# Patient Record
Sex: Female | Born: 1975 | Race: White | Hispanic: No | Marital: Married | Smoking: Current every day smoker
Health system: Southern US, Community
[De-identification: ages and names within clinical notes are randomized; demographics above are authoritative.]

## PROBLEM LIST (undated history)

## (undated) DIAGNOSIS — M549 Dorsalgia, unspecified: Secondary | ICD-10-CM

## (undated) DIAGNOSIS — J45909 Unspecified asthma, uncomplicated: Secondary | ICD-10-CM

## (undated) DIAGNOSIS — E119 Type 2 diabetes mellitus without complications: Secondary | ICD-10-CM

## (undated) DIAGNOSIS — E7800 Pure hypercholesterolemia, unspecified: Secondary | ICD-10-CM

## (undated) DIAGNOSIS — N939 Abnormal uterine and vaginal bleeding, unspecified: Secondary | ICD-10-CM

## (undated) DIAGNOSIS — K589 Irritable bowel syndrome without diarrhea: Secondary | ICD-10-CM

## (undated) DIAGNOSIS — N83209 Unspecified ovarian cyst, unspecified side: Secondary | ICD-10-CM

## (undated) HISTORY — PX: CHOLECYSTECTOMY: SHX55

## (undated) HISTORY — PX: ENDOMETRIAL ABLATION: SHX621

## (undated) HISTORY — PX: WISDOM TOOTH EXTRACTION: SHX21

## (undated) HISTORY — PX: TUBAL LIGATION: SHX77

## (undated) HISTORY — PX: APPENDECTOMY: SHX54

---

## 1999-10-17 ENCOUNTER — Emergency Department (HOSPITAL_COMMUNITY): Admission: EM | Admit: 1999-10-17 | Discharge: 1999-10-17 | Payer: Self-pay | Admitting: Emergency Medicine

## 1999-10-17 ENCOUNTER — Encounter: Payer: Self-pay | Admitting: Emergency Medicine

## 1999-12-13 ENCOUNTER — Emergency Department (HOSPITAL_COMMUNITY): Admission: EM | Admit: 1999-12-13 | Discharge: 1999-12-13 | Payer: Self-pay

## 2000-02-24 ENCOUNTER — Other Ambulatory Visit: Admission: RE | Admit: 2000-02-24 | Discharge: 2000-02-24 | Payer: Self-pay | Admitting: Gynecology

## 2000-08-22 ENCOUNTER — Emergency Department (HOSPITAL_COMMUNITY): Admission: EM | Admit: 2000-08-22 | Discharge: 2000-08-22 | Payer: Self-pay | Admitting: Emergency Medicine

## 2000-08-26 ENCOUNTER — Inpatient Hospital Stay (HOSPITAL_COMMUNITY): Admission: AD | Admit: 2000-08-26 | Discharge: 2000-08-26 | Payer: Self-pay | Admitting: Obstetrics & Gynecology

## 2000-10-06 ENCOUNTER — Emergency Department (HOSPITAL_COMMUNITY): Admission: EM | Admit: 2000-10-06 | Discharge: 2000-10-06 | Payer: Self-pay | Admitting: Emergency Medicine

## 2000-11-03 ENCOUNTER — Emergency Department (HOSPITAL_COMMUNITY): Admission: EM | Admit: 2000-11-03 | Discharge: 2000-11-03 | Payer: Self-pay | Admitting: Emergency Medicine

## 2001-01-28 ENCOUNTER — Inpatient Hospital Stay (HOSPITAL_COMMUNITY): Admission: AD | Admit: 2001-01-28 | Discharge: 2001-01-28 | Payer: Self-pay | Admitting: Obstetrics

## 2001-07-04 ENCOUNTER — Emergency Department (HOSPITAL_COMMUNITY): Admission: EM | Admit: 2001-07-04 | Discharge: 2001-07-04 | Payer: Self-pay | Admitting: Emergency Medicine

## 2001-11-20 ENCOUNTER — Inpatient Hospital Stay (HOSPITAL_COMMUNITY): Admission: AD | Admit: 2001-11-20 | Discharge: 2001-11-20 | Payer: Self-pay | Admitting: Obstetrics

## 2001-11-27 ENCOUNTER — Inpatient Hospital Stay (HOSPITAL_COMMUNITY): Admission: AD | Admit: 2001-11-27 | Discharge: 2001-11-27 | Payer: Self-pay | Admitting: Obstetrics

## 2001-12-15 ENCOUNTER — Observation Stay (HOSPITAL_COMMUNITY): Admission: AD | Admit: 2001-12-15 | Discharge: 2001-12-16 | Payer: Self-pay | Admitting: Obstetrics

## 2001-12-16 ENCOUNTER — Encounter: Payer: Self-pay | Admitting: Obstetrics

## 2002-01-10 ENCOUNTER — Inpatient Hospital Stay (HOSPITAL_COMMUNITY): Admission: AD | Admit: 2002-01-10 | Discharge: 2002-01-10 | Payer: Self-pay | Admitting: Obstetrics

## 2002-01-16 ENCOUNTER — Inpatient Hospital Stay (HOSPITAL_COMMUNITY): Admission: AD | Admit: 2002-01-16 | Discharge: 2002-01-16 | Payer: Self-pay | Admitting: Obstetrics

## 2002-01-27 ENCOUNTER — Inpatient Hospital Stay (HOSPITAL_COMMUNITY): Admission: AD | Admit: 2002-01-27 | Discharge: 2002-01-27 | Payer: Self-pay | Admitting: Obstetrics

## 2002-01-28 ENCOUNTER — Inpatient Hospital Stay (HOSPITAL_COMMUNITY): Admission: AD | Admit: 2002-01-28 | Discharge: 2002-01-30 | Payer: Self-pay | Admitting: Obstetrics

## 2002-08-11 ENCOUNTER — Encounter: Payer: Self-pay | Admitting: Family Medicine

## 2002-08-11 ENCOUNTER — Encounter: Admission: RE | Admit: 2002-08-11 | Discharge: 2002-08-11 | Payer: Self-pay | Admitting: Family Medicine

## 2003-03-09 ENCOUNTER — Emergency Department (HOSPITAL_COMMUNITY): Admission: EM | Admit: 2003-03-09 | Discharge: 2003-03-09 | Payer: Self-pay | Admitting: Emergency Medicine

## 2003-03-11 ENCOUNTER — Emergency Department (HOSPITAL_COMMUNITY): Admission: EM | Admit: 2003-03-11 | Discharge: 2003-03-11 | Payer: Self-pay | Admitting: Emergency Medicine

## 2003-05-06 ENCOUNTER — Emergency Department (HOSPITAL_COMMUNITY): Admission: EM | Admit: 2003-05-06 | Discharge: 2003-05-06 | Payer: Self-pay | Admitting: Emergency Medicine

## 2003-08-26 ENCOUNTER — Inpatient Hospital Stay (HOSPITAL_COMMUNITY): Admission: AD | Admit: 2003-08-26 | Discharge: 2003-08-26 | Payer: Self-pay | Admitting: Obstetrics

## 2003-09-16 ENCOUNTER — Ambulatory Visit (HOSPITAL_COMMUNITY): Admission: RE | Admit: 2003-09-16 | Discharge: 2003-09-16 | Payer: Self-pay | Admitting: Obstetrics

## 2003-09-22 ENCOUNTER — Inpatient Hospital Stay (HOSPITAL_COMMUNITY): Admission: AD | Admit: 2003-09-22 | Discharge: 2003-09-22 | Payer: Self-pay | Admitting: Obstetrics

## 2003-09-30 ENCOUNTER — Ambulatory Visit (HOSPITAL_COMMUNITY): Admission: RE | Admit: 2003-09-30 | Discharge: 2003-09-30 | Payer: Self-pay | Admitting: Obstetrics

## 2003-10-07 ENCOUNTER — Ambulatory Visit (HOSPITAL_COMMUNITY): Admission: RE | Admit: 2003-10-07 | Discharge: 2003-10-07 | Payer: Self-pay | Admitting: Obstetrics

## 2003-10-14 ENCOUNTER — Ambulatory Visit (HOSPITAL_COMMUNITY): Admission: RE | Admit: 2003-10-14 | Discharge: 2003-10-14 | Payer: Self-pay | Admitting: Obstetrics

## 2003-10-21 ENCOUNTER — Encounter: Payer: Self-pay | Admitting: Obstetrics

## 2003-10-21 ENCOUNTER — Ambulatory Visit (HOSPITAL_COMMUNITY): Admission: RE | Admit: 2003-10-21 | Discharge: 2003-10-21 | Payer: Self-pay | Admitting: Obstetrics

## 2003-10-28 ENCOUNTER — Ambulatory Visit (HOSPITAL_COMMUNITY): Admission: RE | Admit: 2003-10-28 | Discharge: 2003-10-28 | Payer: Self-pay | Admitting: Obstetrics

## 2003-11-02 ENCOUNTER — Inpatient Hospital Stay (HOSPITAL_COMMUNITY): Admission: AD | Admit: 2003-11-02 | Discharge: 2003-11-03 | Payer: Self-pay | Admitting: Obstetrics

## 2003-11-04 ENCOUNTER — Observation Stay (HOSPITAL_COMMUNITY): Admission: AD | Admit: 2003-11-04 | Discharge: 2003-11-05 | Payer: Self-pay | Admitting: Obstetrics

## 2003-11-04 ENCOUNTER — Ambulatory Visit (HOSPITAL_COMMUNITY): Admission: RE | Admit: 2003-11-04 | Discharge: 2003-11-04 | Payer: Self-pay | Admitting: Obstetrics

## 2003-11-11 ENCOUNTER — Ambulatory Visit (HOSPITAL_COMMUNITY): Admission: RE | Admit: 2003-11-11 | Discharge: 2003-11-11 | Payer: Self-pay | Admitting: Obstetrics

## 2003-11-17 ENCOUNTER — Inpatient Hospital Stay (HOSPITAL_COMMUNITY): Admission: AD | Admit: 2003-11-17 | Discharge: 2003-11-17 | Payer: Self-pay | Admitting: Obstetrics

## 2003-11-18 ENCOUNTER — Ambulatory Visit (HOSPITAL_COMMUNITY): Admission: RE | Admit: 2003-11-18 | Discharge: 2003-11-18 | Payer: Self-pay | Admitting: Obstetrics

## 2003-11-19 ENCOUNTER — Inpatient Hospital Stay (HOSPITAL_COMMUNITY): Admission: AD | Admit: 2003-11-19 | Discharge: 2003-11-19 | Payer: Self-pay | Admitting: Obstetrics

## 2003-11-24 ENCOUNTER — Observation Stay (HOSPITAL_COMMUNITY): Admission: AD | Admit: 2003-11-24 | Discharge: 2003-11-25 | Payer: Self-pay | Admitting: Obstetrics

## 2003-11-25 ENCOUNTER — Encounter: Payer: Self-pay | Admitting: Obstetrics

## 2003-11-29 ENCOUNTER — Inpatient Hospital Stay (HOSPITAL_COMMUNITY): Admission: AD | Admit: 2003-11-29 | Discharge: 2003-11-30 | Payer: Self-pay | Admitting: Obstetrics

## 2003-12-02 ENCOUNTER — Ambulatory Visit (HOSPITAL_COMMUNITY): Admission: RE | Admit: 2003-12-02 | Discharge: 2003-12-02 | Payer: Self-pay | Admitting: Obstetrics

## 2003-12-05 ENCOUNTER — Inpatient Hospital Stay (HOSPITAL_COMMUNITY): Admission: AD | Admit: 2003-12-05 | Discharge: 2003-12-06 | Payer: Self-pay | Admitting: Obstetrics

## 2003-12-07 ENCOUNTER — Inpatient Hospital Stay (HOSPITAL_COMMUNITY): Admission: AD | Admit: 2003-12-07 | Discharge: 2003-12-10 | Payer: Self-pay | Admitting: Obstetrics

## 2004-01-26 ENCOUNTER — Ambulatory Visit (HOSPITAL_COMMUNITY): Admission: RE | Admit: 2004-01-26 | Discharge: 2004-01-26 | Payer: Self-pay | Admitting: Obstetrics

## 2004-10-04 ENCOUNTER — Ambulatory Visit (HOSPITAL_COMMUNITY): Admission: RE | Admit: 2004-10-04 | Discharge: 2004-10-04 | Payer: Self-pay | Admitting: Obstetrics

## 2004-10-04 ENCOUNTER — Encounter (INDEPENDENT_AMBULATORY_CARE_PROVIDER_SITE_OTHER): Payer: Self-pay | Admitting: *Deleted

## 2005-09-29 ENCOUNTER — Emergency Department: Payer: Self-pay | Admitting: Emergency Medicine

## 2006-01-30 ENCOUNTER — Emergency Department (HOSPITAL_COMMUNITY): Admission: EM | Admit: 2006-01-30 | Discharge: 2006-01-30 | Payer: Self-pay | Admitting: Emergency Medicine

## 2006-03-06 ENCOUNTER — Emergency Department (HOSPITAL_COMMUNITY): Admission: EM | Admit: 2006-03-06 | Discharge: 2006-03-06 | Payer: Self-pay | Admitting: Family Medicine

## 2006-07-03 ENCOUNTER — Emergency Department (HOSPITAL_COMMUNITY): Admission: EM | Admit: 2006-07-03 | Discharge: 2006-07-03 | Payer: Self-pay | Admitting: Emergency Medicine

## 2006-09-03 ENCOUNTER — Emergency Department (HOSPITAL_COMMUNITY): Admission: EM | Admit: 2006-09-03 | Discharge: 2006-09-03 | Payer: Self-pay | Admitting: Emergency Medicine

## 2007-02-01 ENCOUNTER — Encounter: Admission: RE | Admit: 2007-02-01 | Discharge: 2007-02-01 | Payer: Self-pay | Admitting: Family Medicine

## 2007-03-27 ENCOUNTER — Encounter: Admission: RE | Admit: 2007-03-27 | Discharge: 2007-03-27 | Payer: Self-pay | Admitting: Internal Medicine

## 2007-04-12 ENCOUNTER — Ambulatory Visit (HOSPITAL_COMMUNITY): Admission: RE | Admit: 2007-04-12 | Discharge: 2007-04-12 | Payer: Self-pay | Admitting: Obstetrics

## 2007-12-08 ENCOUNTER — Emergency Department: Payer: Self-pay | Admitting: Unknown Physician Specialty

## 2008-01-06 ENCOUNTER — Emergency Department: Payer: Self-pay | Admitting: Emergency Medicine

## 2008-02-04 ENCOUNTER — Emergency Department (HOSPITAL_COMMUNITY): Admission: EM | Admit: 2008-02-04 | Discharge: 2008-02-04 | Payer: Self-pay | Admitting: Family Medicine

## 2008-02-04 ENCOUNTER — Emergency Department: Payer: Self-pay | Admitting: Emergency Medicine

## 2008-05-19 ENCOUNTER — Emergency Department: Payer: Self-pay | Admitting: Emergency Medicine

## 2008-06-02 ENCOUNTER — Emergency Department: Payer: Self-pay | Admitting: Emergency Medicine

## 2008-10-29 ENCOUNTER — Inpatient Hospital Stay: Payer: Self-pay | Admitting: Vascular Surgery

## 2009-10-20 ENCOUNTER — Ambulatory Visit: Payer: Self-pay | Admitting: Internal Medicine

## 2010-05-09 IMAGING — CR DG LUMBAR SPINE 2-3V
1 series · 4 of 4 positions shown · non-contrast
Comparison: none

REASON FOR EXAM: pain
COMMENTS:

[Series 1: view not recorded · 0.17mm/px · 4 of 4 slices shown]
[im 1/4]
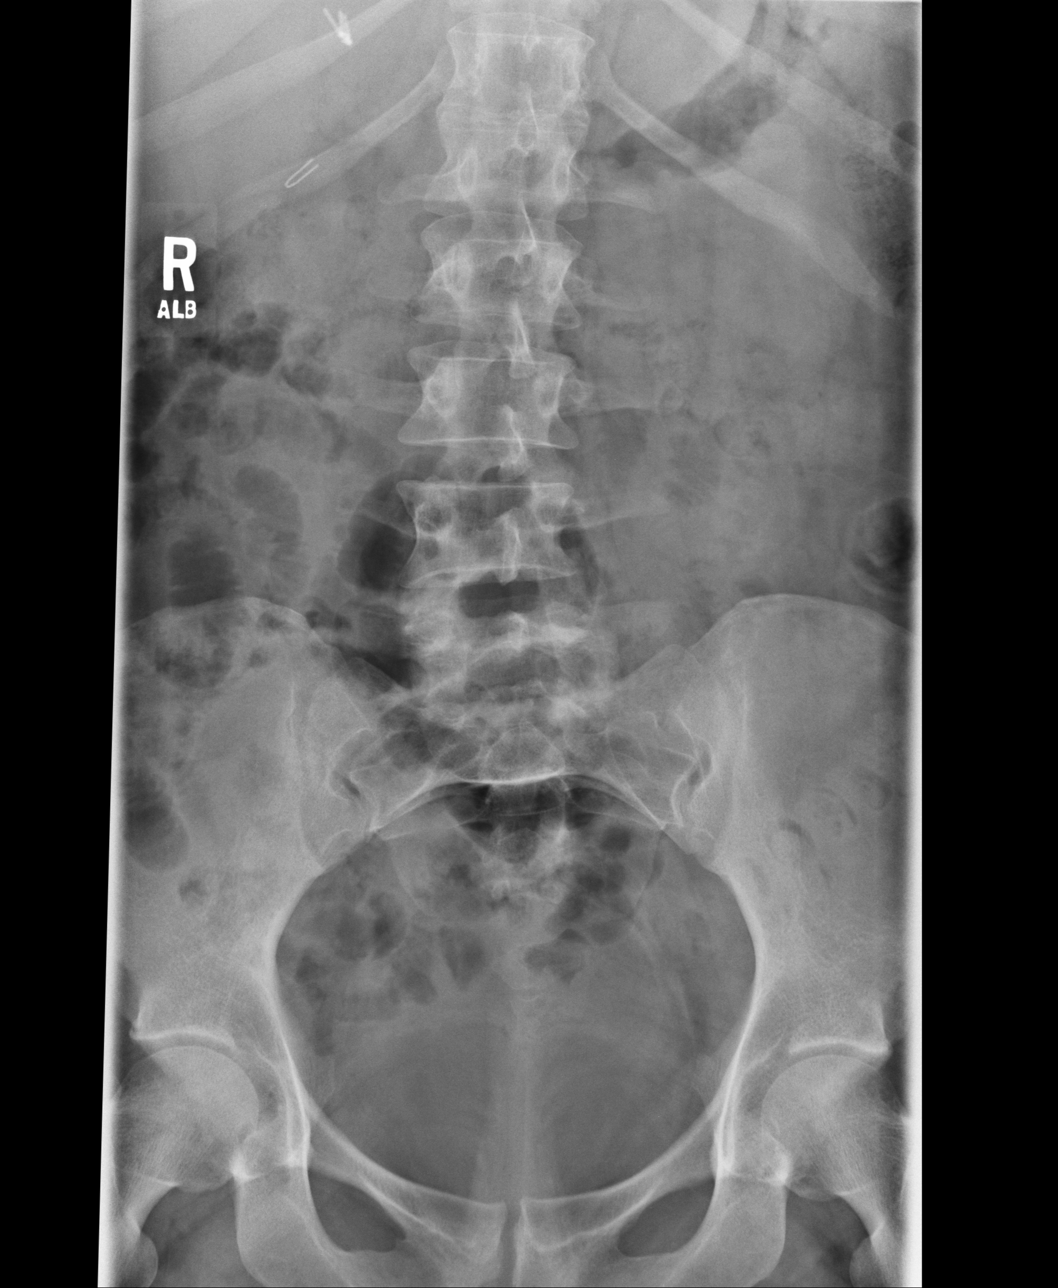
[im 2/4]
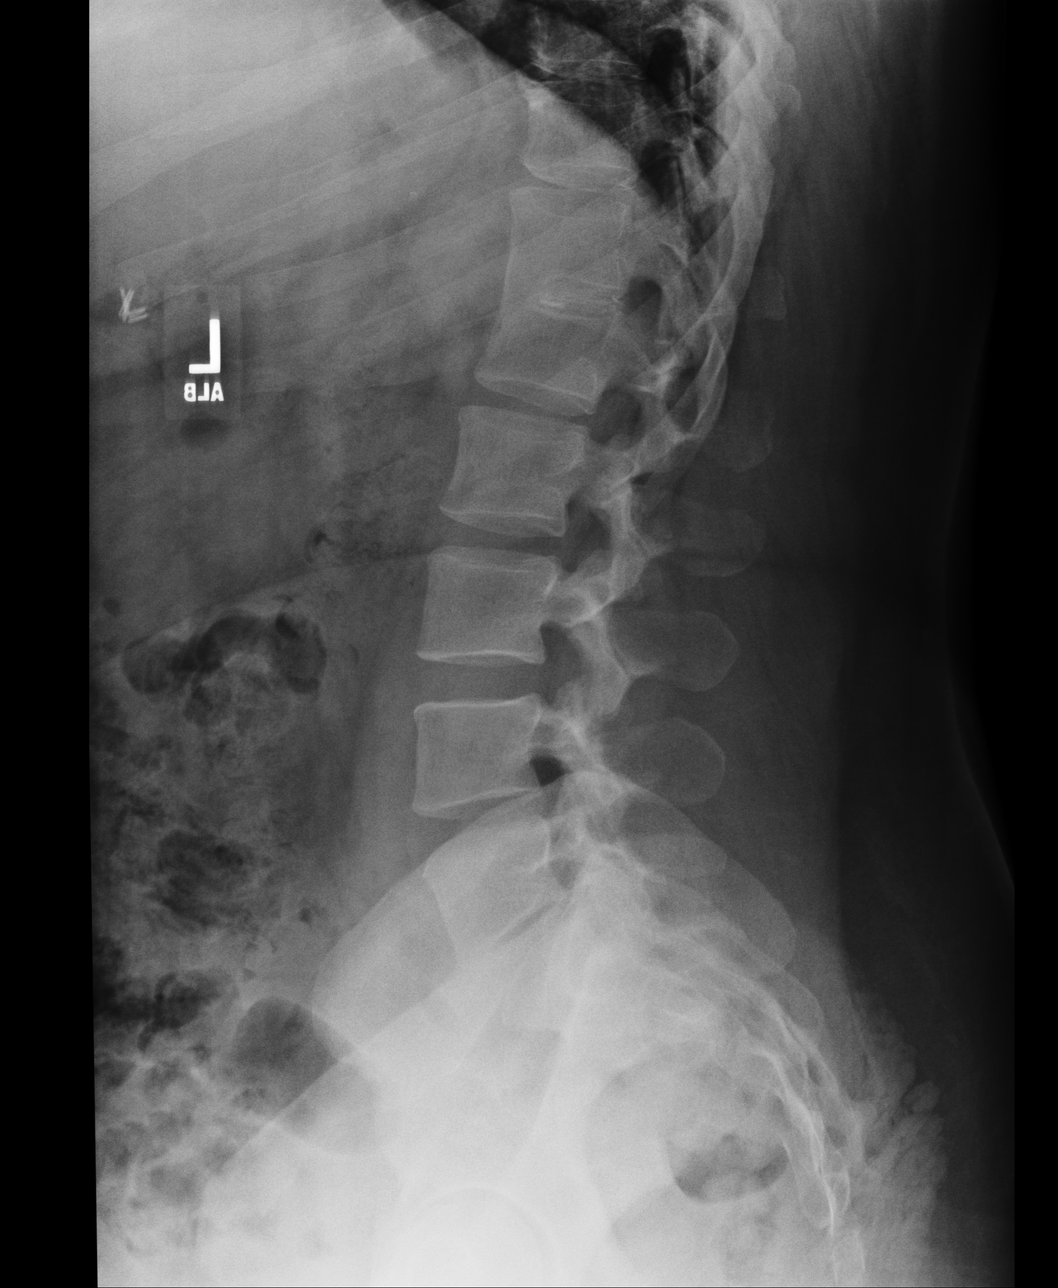
[im 3/4]
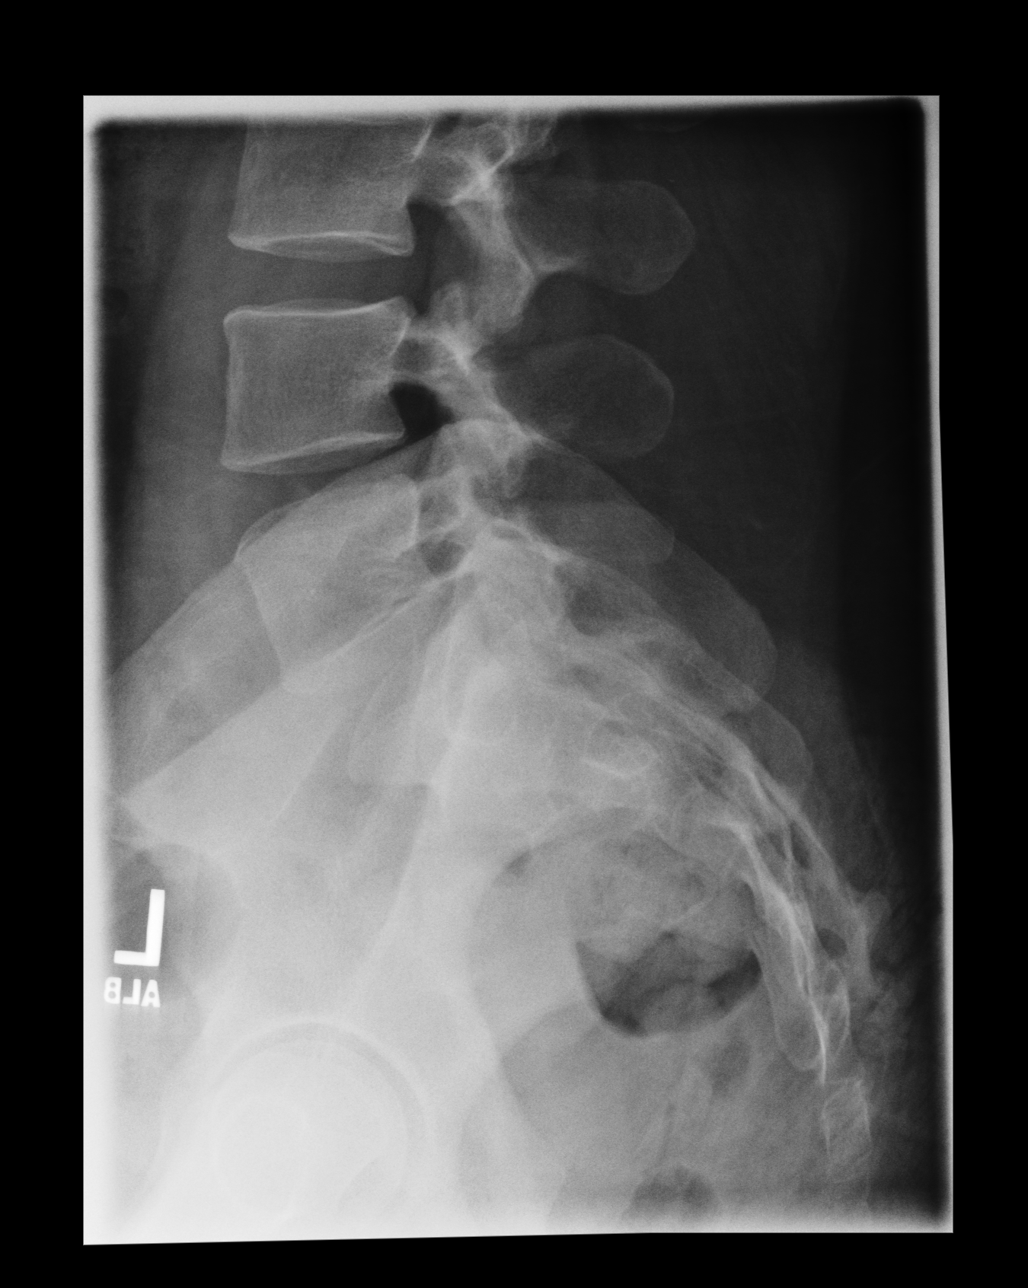
[im 4/4]
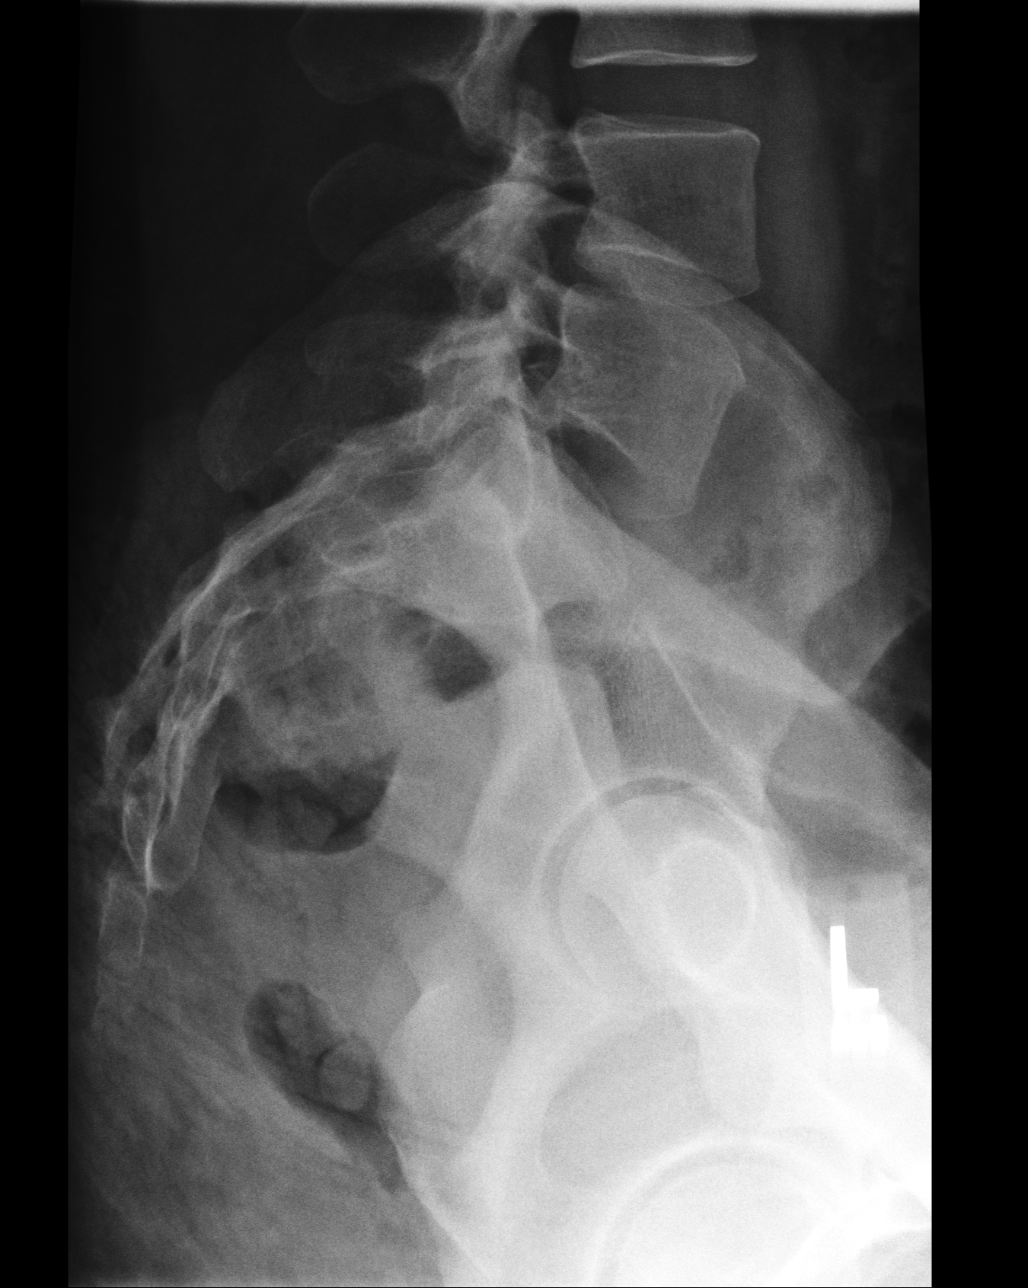

[4 of 4 positions shown; findings below may reference images not displayed]

PROCEDURE:     DXR - DXR LUMBAR SPINE AP AND LATERAL  - January 06, 2008  [DATE]

RESULT:     The vertebral body heights are well maintained. No fracture is
seen. The vertebral body alignment is normal. There is narrowing of the
L4-L5 intervertebral disc space. This could either be developmental or
secondary to early manifestation of disc disease. There is no associated
sclerosis of the adjacent vertebral plates and no spur formation is seen at
this level. There is anterior fusion of T12 and L1 consistent with a
developmental anomaly. The pedicles are bilaterally intact.
IMPRESSION: 1. No fracture is seen.
2. There is slight narrowing of the L4-L5 intervertebral disc space. For a
patient of this age the finding is likely developmental. If, however, there
is symptomatology referable to this level, then further evaluation by MR
would be recommended.
3. There is noted partial congenital fusion of the T12 and L1 vertebral
bodies.

## 2010-06-05 ENCOUNTER — Encounter: Payer: Self-pay | Admitting: Obstetrics

## 2010-06-06 ENCOUNTER — Encounter: Payer: Self-pay | Admitting: Family Medicine

## 2010-09-30 NOTE — Op Note (Signed)
NAME:  Karen Solis, Karen Solis                       ACCOUNT NO.:  [id number]   MEDICAL RECORD NO.:  [id number]                   PATIENT TYPE:  AMB   LOCATION:  SDC                                  FACILITY:  [facility]   PHYSICIAN:  Kathreen Cosier, M.D.           DATE OF BIRTH:  Dec 29, 1975   DATE OF PROCEDURE:  01/26/2004  DATE OF DISCHARGE:                                 OPERATIVE REPORT   PREOPERATIVE DIAGNOSIS:  Multiparity.   PROCEDURE:  Open laparoscopic tubal sterilization.   ANESTHESIA:  General anesthesia.   PROCEDURE IN DETAIL:  The patient in the lithotomy position.  Abdomen,  peritoneum, and vagina prepped and draped.  Bladder emptied with straight  catheter.  A transverse incision made through the old subumbilical incision,  carried down to the fascia.  The fascia grasped with two Kochers and the  fascia opened with the Mayo scissors.  The sleeve of the trocar inserted  intraperitoneally.  3 L of carbon dioxide infused intraperitoneally.  The  uterus was visualized and the scope inserted through the sleeve of the  trocar and the uterus, tubes, and ovaries were normal.  Cautery probe  inserted through the sleeve of the scope.  Right tube grasped 1 inch from  the cornua and cauterized.  The tube cauterized in a total of four places  moving lateral from the first site of cautery.  The procedure was done in a  similar fashion on the other side.  Probes were removed.  CO2 allowed to  escape from the peritoneal cavity.  The fascia closed with one stitch of 0  Dexon and the skin closed with subcuticular stitch of 3-0 Monocryl.                                               Kathreen Cosier, M.D.    BAM/MEDQ  D:  01/26/2004  T:  01/26/2004  Job:  [id number]

## 2010-09-30 NOTE — Op Note (Signed)
Karen Solis, Karen Solis             ACCOUNT NO.:  [id number]   MEDICAL RECORD NO.:  [id number]          PATIENT TYPE:  AMB   LOCATION:  SDC                           FACILITY:  [facility]   PHYSICIAN:  Kathreen Cosier, M.D.DATE OF BIRTH:  05/06/1976   DATE OF PROCEDURE:  10/04/2004  DATE OF DISCHARGE:                                 OPERATIVE REPORT   PREOPERATIVE DIAGNOSIS:  Dysfunctional uterine bleeding.   Under general anesthesia, with the patient in the lithotomy position,  perineum and vagina were prepped and draped.  Bladder emptied with a  straight catheter.  Bimanual exam revealed a normal-size uterus.  Negative  adnexa.  Speculum placed in the vagina.  Anterior lip of the cervix grasped  with a tenaculum.  Endocervix curetted.  Small amount of tissue obtained.  Endometrial cavity sounded to 9 cm.  Then, using Hagar dilators, the cervix  was measured at 5 cm, making a cavity length of 4 cm.  The cervix was  dilated with #25 Shawnie Pons and a 5 mm hysteroscope inserted.  The cavity was  normal.  Sharp curettage performed, with a moderate amount of tissue  obtained.  Then, a NovaSure device was inserted in the cavity and integrity  test passed.  Ablation was performed for 1 minute 45 seconds at 66 watts,  and then the hysteroscopy was repeated, and there was total ablation of the  cavity.  Fluid deficit was 20 cc.  The patient tolerated the procedure well  and was taken to the recovery room in good condition.      BAM/MEDQ  D:  10/04/2004  T:  10/04/2004  Job:  [id number]

## 2010-11-22 ENCOUNTER — Emergency Department: Payer: Self-pay | Admitting: Emergency Medicine

## 2010-12-13 ENCOUNTER — Ambulatory Visit: Payer: Self-pay | Admitting: Cardiology

## 2011-01-05 ENCOUNTER — Emergency Department: Payer: Self-pay | Admitting: Emergency Medicine

## 2011-02-13 LAB — POCT RAPID STREP A: Streptococcus, Group A Screen (Direct): NEGATIVE

## 2011-04-03 ENCOUNTER — Emergency Department: Payer: Self-pay | Admitting: Emergency Medicine

## 2011-04-25 ENCOUNTER — Emergency Department: Payer: Self-pay | Admitting: Emergency Medicine

## 2012-10-09 ENCOUNTER — Emergency Department (HOSPITAL_COMMUNITY): Payer: BC Managed Care – PPO

## 2012-10-09 ENCOUNTER — Encounter (HOSPITAL_COMMUNITY): Payer: Self-pay | Admitting: *Deleted

## 2012-10-09 ENCOUNTER — Emergency Department (HOSPITAL_COMMUNITY)
Admission: EM | Admit: 2012-10-09 | Discharge: 2012-10-09 | Disposition: A | Discharge status: Home / Self Care | Payer: BC Managed Care – PPO | Attending: Emergency Medicine | Admitting: Emergency Medicine

## 2012-10-09 DIAGNOSIS — M549 Dorsalgia, unspecified: Secondary | ICD-10-CM | POA: Insufficient documentation

## 2012-10-09 DIAGNOSIS — F17200 Nicotine dependence, unspecified, uncomplicated: Secondary | ICD-10-CM | POA: Insufficient documentation

## 2012-10-09 DIAGNOSIS — Y939 Activity, unspecified: Secondary | ICD-10-CM | POA: Insufficient documentation

## 2012-10-09 DIAGNOSIS — E7800 Pure hypercholesterolemia, unspecified: Secondary | ICD-10-CM | POA: Insufficient documentation

## 2012-10-09 DIAGNOSIS — S93609A Unspecified sprain of unspecified foot, initial encounter: Secondary | ICD-10-CM | POA: Insufficient documentation

## 2012-10-09 DIAGNOSIS — M25562 Pain in left knee: Secondary | ICD-10-CM

## 2012-10-09 DIAGNOSIS — Y929 Unspecified place or not applicable: Secondary | ICD-10-CM | POA: Insufficient documentation

## 2012-10-09 DIAGNOSIS — S8000XA Contusion of unspecified knee, initial encounter: Secondary | ICD-10-CM | POA: Insufficient documentation

## 2012-10-09 DIAGNOSIS — W010XXA Fall on same level from slipping, tripping and stumbling without subsequent striking against object, initial encounter: Secondary | ICD-10-CM | POA: Insufficient documentation

## 2012-10-09 DIAGNOSIS — G8929 Other chronic pain: Secondary | ICD-10-CM | POA: Insufficient documentation

## 2012-10-09 DIAGNOSIS — Z87828 Personal history of other (healed) physical injury and trauma: Secondary | ICD-10-CM | POA: Insufficient documentation

## 2012-10-09 DIAGNOSIS — Z79899 Other long term (current) drug therapy: Secondary | ICD-10-CM | POA: Insufficient documentation

## 2012-10-09 DIAGNOSIS — T148XXA Other injury of unspecified body region, initial encounter: Secondary | ICD-10-CM

## 2012-10-09 DIAGNOSIS — S93601A Unspecified sprain of right foot, initial encounter: Secondary | ICD-10-CM

## 2012-10-09 HISTORY — DX: Pure hypercholesterolemia, unspecified: E78.00

## 2012-10-09 HISTORY — DX: Dorsalgia, unspecified: M54.9

## 2012-10-09 NOTE — ED Provider Notes (Signed)
History     CSN: [id number]  Arrival date & time 10/09/12  1016   First MD Initiated Contact with Patient 10/09/12 1035      Chief Complaint  Patient presents with  . Foot Pain  . Knee Pain    (Consider location/radiation/quality/duration/timing/severity/associated sxs/prior treatment) HPI Pt is a 37yo obese female c/o left knee pain and right foot pain after slipping and falling this morning.  Pt has hx of chronic back pain and is in pain management.  Pt states she slipped on slick spot on floor at her house.  Landed on left knee and right foot bent back behind her. Pain in left knee is aching, 9/10, constant, does not radiate.  Pain in right foot on dorsal aspect is achy and sharp, 9/10, constant and does not radiate.  Pain is worse with weight bearing. Reports hitting her head but no LOC.  Pt states she did not hit her head very hard.   Past Medical History  Diagnosis Date  . High cholesterol   . Back pain     2012 back injury, in pain management    Past Surgical History  Procedure Laterality Date  . Wisdom tooth extraction      1997  . Cholecystectomy      2000  . Tubal ligation      2005  . Endometrial ablation      2005  . Appendectomy      2010    History reviewed. No pertinent family history.  History  Substance Use Topics  . Smoking status: Current Every Day Smoker -- 0.50 packs/day for 15 years    Types: Cigarettes  . Smokeless tobacco: Not on file  . Alcohol Use: No    OB History   Grav Para Term Preterm Abortions TAB SAB Ect Mult Living                  Review of Systems  Musculoskeletal: Positive for myalgias, joint swelling and arthralgias.  Skin: Negative.   Neurological: Negative for dizziness, syncope and headaches.    Allergies  Review of patient's allergies indicates no known allergies.  Home Medications   Current Outpatient Rx  Name  Route  Sig  Dispense  Refill  . ALPRAZolam (XANAX) 0.5 MG tablet   Oral   Take 0.5 mg by mouth  at bedtime as needed for sleep.         Marland Kitchen escitalopram (LEXAPRO) 10 MG tablet   Oral   Take 10 mg by mouth daily.         Marland Kitchen oxyCODONE-acetaminophen (PERCOCET) 5-325 MG per tablet   Oral   Take 1 tablet by mouth every 8 (eight) hours as needed for pain.         . simvastatin (ZOCOR) 20 MG tablet   Oral   Take 20 mg by mouth every evening.           BP 145/83  Pulse 69  Temp(Src) 97.9 F (36.6 C) (Oral)  Resp 18  SpO2 99%  LMP 10/12/2003  Physical Exam  Nursing note and vitals reviewed. Constitutional: She appears well-developed and well-nourished. No distress.  Morbidly obese female sitting in wheelchair with right foot propped up  HENT:  Head: Normocephalic and atraumatic.  Eyes: Conjunctivae are normal. No scleral icterus.  Neck: Normal range of motion.  Cardiovascular: Normal rate, regular rhythm and normal heart sounds.   Pulmonary/Chest: Effort normal and breath sounds normal. No respiratory distress. She has no wheezes.  Musculoskeletal: Normal range of motion. She exhibits edema ( dorsum of right foot) and tenderness ( dorsom of right foot, medial aspect of left knee).  Left knee pain. Right foot pain, CMS in tact.  Neurological: She is alert.  Skin: Skin is warm and dry. No rash noted. She is not diaphoretic. No erythema. No pallor.  Ecchymosis medial aspect left knee  Psychiatric: She has a normal mood and affect. Her behavior is normal.    ED Course  Procedures (including critical care time)  Labs Reviewed - No data to display Dg Knee Complete 4 Views Left  10/09/2012   *RADIOLOGY REPORT*  Clinical Data: Knee pain following injury  LEFT KNEE - COMPLETE 4+ VIEW  Comparison: None.  Findings: No acute fracture or dislocation is noted.  No soft tissue abnormality is seen.  IMPRESSION: No acute abnormality noted.   Original Report Authenticated By: Alcide Clever, M.D.   Dg Knee Complete 4 Views Right  10/09/2012   *RADIOLOGY REPORT*  Clinical Data: Knee  pain following injury  RIGHT KNEE - COMPLETE 4+ VIEW  Comparison: None.  Findings: No acute fracture or dislocation is noted.  No soft tissue abnormality is seen.  IMPRESSION: No acute abnormality noted.   Original Report Authenticated By: Alcide Clever, M.D.   Dg Foot Complete Right  10/09/2012   *RADIOLOGY REPORT*  Clinical Data: Foot pain  RIGHT FOOT COMPLETE - 3+ VIEW  Comparison: None.  Findings: Normal alignment without fracture.  Preserved joint spaces.  No soft tissue abnormality or radiopaque foreign body.  IMPRESSION: No acute osseous finding   Original Report Authenticated By: Judie Petit. Shick, M.D.     1. Contusion   2. Right foot sprain, initial encounter   3. Left knee pain       MDM  Pt with hx of chronic back pain, is in pain management, c/o left knee pain and right foot pain.  Pt is morbidly obese. Pt was in wheelchair in exam room.  Pain with weight bearing and ambulation. Contusion on medial aspect of left knee and TTP.  FROM.  Right foot: mild edema to dorsal aspect.  No ecchymosis or wound.  TTP. CMS in tact.   Palin film left knee: no acute abnormality, no fx or dislocation.  Plain film right foot: no acute osseous finding. No fx.  Rx: wheelchair. Post-op shoe Pt in pain management, does not need pain meds. May take OTC tylenol and ibuprofen for swelling and use ice.  Vitals: unremarkable. Discharged in stable condition.    Discussed pt with attending during ED encounter.   Junius Finner, PA-C 10/09/12 1539

## 2012-10-09 NOTE — ED Notes (Signed)
Pt reports she slipped and fell this morning. Landed on left knee, pain to right foot, pain to both knees. Left knee swollen. Right foot swollen with decreased sensation. Pain 9/10. Reports she hit her head but denies LOC.

## 2012-10-09 NOTE — ED Provider Notes (Signed)
Medical screening examination/treatment/procedure(s) were performed by non-physician practitioner and as supervising physician I was immediately available for consultation/collaboration.    Dasie Chancellor R Iran Rowe, MD 10/09/12 1612 

## 2012-10-19 ENCOUNTER — Encounter (HOSPITAL_COMMUNITY): Payer: Self-pay | Admitting: *Deleted

## 2012-10-19 ENCOUNTER — Emergency Department (HOSPITAL_COMMUNITY)
Admission: EM | Admit: 2012-10-19 | Discharge: 2012-10-19 | Disposition: A | Discharge status: Home / Self Care | Payer: BC Managed Care – PPO | Attending: Emergency Medicine | Admitting: Emergency Medicine

## 2012-10-19 DIAGNOSIS — Z79899 Other long term (current) drug therapy: Secondary | ICD-10-CM | POA: Insufficient documentation

## 2012-10-19 DIAGNOSIS — F17200 Nicotine dependence, unspecified, uncomplicated: Secondary | ICD-10-CM | POA: Insufficient documentation

## 2012-10-19 DIAGNOSIS — Z4789 Encounter for other orthopedic aftercare: Secondary | ICD-10-CM | POA: Insufficient documentation

## 2012-10-19 DIAGNOSIS — Z8739 Personal history of other diseases of the musculoskeletal system and connective tissue: Secondary | ICD-10-CM | POA: Insufficient documentation

## 2012-10-19 DIAGNOSIS — E7800 Pure hypercholesterolemia, unspecified: Secondary | ICD-10-CM | POA: Insufficient documentation

## 2012-10-19 DIAGNOSIS — Z4689 Encounter for fitting and adjustment of other specified devices: Secondary | ICD-10-CM | POA: Insufficient documentation

## 2012-10-19 NOTE — Consult Note (Signed)
Reason for Consult:right foot fracture, cast problem Referring Physician: EDP  Karen Solis is an 37 y.o. female.  HPI: Patient is 10 days out from a fall with a nondisplaced right foot fracture. Patient of Dr Toni Arthurs, GOC. Patient complained of increasing right foot and ankle pain and swelling over last two days. Patient here for cast removal and exam.  Past Medical History  Diagnosis Date  . High cholesterol   . Back pain     2012 back injury, in pain management    Past Surgical History  Procedure Laterality Date  . Wisdom tooth extraction      1997  . Cholecystectomy      2000  . Tubal ligation      2005  . Endometrial ablation      2005  . Appendectomy      2010    History reviewed. No pertinent family history.  Social History:  reports that she has been smoking Cigarettes.  She has a 7.5 pack-year smoking history. She does not have any smokeless tobacco history on file. She reports that she does not drink alcohol or use illicit drugs.  Allergies: No Known Allergies  Medications: I have reviewed the patient's current medications.  No results found for this or any previous visit (from the past 48 hour(s)).  No results found.  ROS Blood pressure 162/82, pulse 92, temperature 98.7 F (37.1 C), temperature source Oral, resp. rate 20, last menstrual period 10/12/2003, SpO2 98.00%. Physical Exam Right leg supple, no tenderness in the calf, compartments supple. No cords.  Right ankle nontender and no deformity, no effusion, right forefoot tender over the 4th and 5th MTs. Skin intact, mod swelling.  Assessment/Plan: Right foot fractures, stable.  Patient is going to class and travelling to [place] from [facility] and thus will not be able to keep her leg elevated.  I am recommending change to a CAM walker due to these circumstances.  Follow up with Dr Karen Solis as scheduled in 10 days.  Karen Solis,STEVEN R 10/19/2012, 10:07 PM

## 2012-10-19 NOTE — ED Provider Notes (Signed)
History    This chart was scribed for Arthor Captain PA-C, a non-physician practitioner working with Gilda Crease, * by Lewanda Rife, ED Scribe. This patient was seen in room [facility] and the patient's care was started at 2231.      CSN: [id number]  Arrival date & time 10/19/12  2012   First MD Initiated Contact with Patient 10/19/12 2229      Chief Complaint  Patient presents with  . Foot Swelling    Cast is tight    (Consider location/radiation/quality/duration/timing/severity/associated sxs/prior treatment) The history is provided by the patient.   HPI Comments: Karen Solis is a 37 y.o. female who presents to the Emergency Department complaining of constant worsening swelling of right foot onset 1 week. Reports cast is getting tight. Reports previously dx with foot fx. Reports urinating in the cast after coughing PTA. Called Dr. Ranell Patrick who came to ED to change cast to cam walker. Denies aggravating or alleviating factors. Denies trying to elevate leg for swelling.     October 28, 2012 Follow up appointment  Past Medical History  Diagnosis Date  . High cholesterol   . Back pain     2012 back injury, in pain management    Past Surgical History  Procedure Laterality Date  . Wisdom tooth extraction      1997  . Cholecystectomy      2000  . Tubal ligation      2005  . Endometrial ablation      2005  . Appendectomy      2010    History reviewed. No pertinent family history.  History  Substance Use Topics  . Smoking status: Current Every Day Smoker -- 0.50 packs/day for 15 years    Types: Cigarettes  . Smokeless tobacco: Not on file  . Alcohol Use: No    OB History   Grav Para Term Preterm Abortions TAB SAB Ect Mult Living                  Review of Systems  Constitutional: Negative for fever.  Skin:       Right foot swelling   Psychiatric/Behavioral: Negative for confusion.  All other systems reviewed and are negative.   A  complete 10 system review of systems was obtained and all systems are negative except as noted in the HPI and PMH.    Allergies  Review of patient's allergies indicates no known allergies.  Home Medications   Current Outpatient Rx  Name  Route  Sig  Dispense  Refill  . ALPRAZolam (XANAX) 0.5 MG tablet   Oral   Take 0.5 mg by mouth at bedtime as needed for sleep.         Marland Kitchen escitalopram (LEXAPRO) 10 MG tablet   Oral   Take 10 mg by mouth every morning.          Marland Kitchen ibuprofen (ADVIL,MOTRIN) 200 MG tablet   Oral   Take 800 mg by mouth every 8 (eight) hours as needed for pain.         Marland Kitchen oxyCODONE-acetaminophen (PERCOCET) 5-325 MG per tablet   Oral   Take 1 tablet by mouth every 8 (eight) hours as needed for pain.         . simvastatin (ZOCOR) 20 MG tablet   Oral   Take 20 mg by mouth every evening.           BP 162/82  Pulse 92  Temp(Src) 98.7 F (37.1 C) (  Oral)  Resp 20  SpO2 98%  LMP 10/12/2003  Physical Exam  Nursing note and vitals reviewed. Constitutional: She is oriented to person, place, and time. She appears well-developed and well-nourished. No distress.  HENT:  Head: Normocephalic and atraumatic.  Eyes: EOM are normal.  Neck: Neck supple. No tracheal deviation present.  Cardiovascular: Normal rate.   Cap refill less than <3 seconds   Pulmonary/Chest: Effort normal. No respiratory distress.  Musculoskeletal: Normal range of motion.  Neurological: She is alert and oriented to person, place, and time.  Skin: Skin is warm and dry.  Psychiatric: She has a normal mood and affect. Her behavior is normal.    ED Course  Procedures (including critical care time) Medications - No data to display 2200 Pt was evaluated by Dr. Ranell Patrick in ED and will follow up October 28, 2012 Labs Reviewed - No data to display No results found.   1. Cast discomfort       MDM  10:19 AM BP 138/86  Pulse 70  Temp(Src) 97.9 F (36.6 C) (Oral)  Resp 16  SpO2 98%  LMP  10/12/2003 Patient cast removed and placed in splint. F/U and directions per Dr. Ranell Patrick. No signs of compartment.   I personally performed the services described in this documentation, which was scribed in my presence. The recorded information has been reviewed and is accurate.     Arthor Captain, PA-C 10/23/12 1022

## 2012-10-19 NOTE — ED Notes (Signed)
Pt fell x 1.5 weeks ago. Pt c/o foot and ankle swelling. Pt dx'd w/ fx of R ankle x 1 wk ago. Pt got the cast wet x 1 day ago and cast began feeling tight around her ankle. Pt states foot swelling beginning today.

## 2012-10-25 NOTE — ED Provider Notes (Signed)
Medical screening examination/treatment/procedure(s) were performed by non-physician practitioner and as supervising physician I was immediately available for consultation/collaboration.   Christopher J. Pollina, MD 10/25/12 1050 

## 2014-06-23 ENCOUNTER — Inpatient Hospital Stay (HOSPITAL_COMMUNITY)
Admission: AD | Admit: 2014-06-23 | Discharge: 2014-06-23 | Disposition: A | Discharge status: Home / Self Care | Payer: No Typology Code available for payment source | Source: Ambulatory Visit | Attending: Family Medicine | Admitting: Family Medicine

## 2014-06-23 ENCOUNTER — Inpatient Hospital Stay (HOSPITAL_COMMUNITY): Payer: No Typology Code available for payment source

## 2014-06-23 ENCOUNTER — Encounter (HOSPITAL_COMMUNITY): Payer: Self-pay | Admitting: *Deleted

## 2014-06-23 DIAGNOSIS — N949 Unspecified condition associated with female genital organs and menstrual cycle: Secondary | ICD-10-CM | POA: Diagnosis not present

## 2014-06-23 DIAGNOSIS — R102 Pelvic and perineal pain: Secondary | ICD-10-CM

## 2014-06-23 DIAGNOSIS — F17210 Nicotine dependence, cigarettes, uncomplicated: Secondary | ICD-10-CM | POA: Diagnosis not present

## 2014-06-23 DIAGNOSIS — E780 Pure hypercholesterolemia: Secondary | ICD-10-CM | POA: Diagnosis not present

## 2014-06-23 DIAGNOSIS — R109 Unspecified abdominal pain: Secondary | ICD-10-CM | POA: Diagnosis present

## 2014-06-23 DIAGNOSIS — E119 Type 2 diabetes mellitus without complications: Secondary | ICD-10-CM | POA: Insufficient documentation

## 2014-06-23 HISTORY — DX: Abnormal uterine and vaginal bleeding, unspecified: N93.9

## 2014-06-23 HISTORY — DX: Unspecified asthma, uncomplicated: J45.909

## 2014-06-23 HISTORY — DX: Type 2 diabetes mellitus without complications: E11.9

## 2014-06-23 HISTORY — DX: Unspecified ovarian cyst, unspecified side: N83.209

## 2014-06-23 HISTORY — DX: Irritable bowel syndrome without diarrhea: K58.9

## 2014-06-23 LAB — CBC WITH DIFFERENTIAL/PLATELET
Basophils Absolute: 0 10*3/uL (ref 0.0–0.1)
Basophils Relative: 0 % (ref 0–1)
Eosinophils Absolute: 0.2 10*3/uL (ref 0.0–0.7)
Eosinophils Relative: 2 % (ref 0–5)
HCT: 42.3 % (ref 36.0–46.0)
HEMOGLOBIN: 14.4 g/dL (ref 12.0–15.0)
LYMPHS ABS: 3.8 10*3/uL (ref 0.7–4.0)
Lymphocytes Relative: 42 % (ref 12–46)
MCH: 30.4 pg (ref 26.0–34.0)
MCHC: 34 g/dL (ref 30.0–36.0)
MCV: 89.4 fL (ref 78.0–100.0)
MONOS PCT: 6 % (ref 3–12)
Monocytes Absolute: 0.6 10*3/uL (ref 0.1–1.0)
NEUTROS ABS: 4.6 10*3/uL (ref 1.7–7.7)
Neutrophils Relative %: 50 % (ref 43–77)
Platelets: 258 10*3/uL (ref 150–400)
RBC: 4.73 MIL/uL (ref 3.87–5.11)
RDW: 12.9 % (ref 11.5–15.5)
WBC: 9.2 10*3/uL (ref 4.0–10.5)

## 2014-06-23 LAB — URINALYSIS, ROUTINE W REFLEX MICROSCOPIC
BILIRUBIN URINE: NEGATIVE
GLUCOSE, UA: NEGATIVE mg/dL
HGB URINE DIPSTICK: NEGATIVE
KETONES UR: NEGATIVE mg/dL
LEUKOCYTES UA: NEGATIVE
Nitrite: NEGATIVE
PROTEIN: NEGATIVE mg/dL
Specific Gravity, Urine: 1.02 (ref 1.005–1.030)
Urobilinogen, UA: 0.2 mg/dL (ref 0.0–1.0)
pH: 6.5 (ref 5.0–8.0)

## 2014-06-23 LAB — WET PREP, GENITAL
CLUE CELLS WET PREP: NONE SEEN
TRICH WET PREP: NONE SEEN
Yeast Wet Prep HPF POC: NONE SEEN

## 2014-06-23 MED ORDER — NAPROXEN 500 MG PO TABS: 500.0000 mg | ORAL_TABLET | Freq: Two times a day (BID) | ORAL | Status: AC

## 2014-06-23 MED ORDER — KETOROLAC TROMETHAMINE 60 MG/2ML IM SOLN
60.0000 mg | Freq: Once | INTRAMUSCULAR | Status: AC
  Administered 2014-06-23: 60 mg via INTRAMUSCULAR
  Filled 2014-06-23: qty 2

## 2014-06-23 NOTE — MAU Note (Addendum)
2-3 weeks ago began to have nausea, breast tenderness, cramping, "sxs of pregnancy." States she has had a BTL and ablation. States she was seen yesterday at [facility] and had a negative blood test and negative UPT. Patient is still not convinced that she is not pregnant. Also states she had very painful intercourse last night. Not usually painful. C/O HA.

## 2014-06-23 NOTE — Discharge Instructions (Signed)
Your blood work today and urine are normal. Your ultrasound shows nothing acute. We are not sure of the reason for your pain we are treating you with medication for pain and inflammation. Follow up with your doctor. Return here as needed.

## 2014-06-23 NOTE — [facility] Provider Note (Signed)
CSN: [id number]     Arrival date & time 06/23/14  1632 History   None    Chief Complaint  Patient presents with  . Nausea     (Consider location/radiation/quality/duration/timing/severity/associated sxs/prior Treatment) Patient is a 39 y.o. female presenting with abdominal pain. The history is provided by the patient.  Abdominal Pain The primary symptoms of the illness include abdominal pain.   Karen Solis is a 39 y.o. female who presents to the MAU with abdominal pain. That started 3 weeks ago. It started as a cramping pain and has gotten worse. She has not had a period in 11 years. She had an ablation. She did take a HPT last week that was positive so her PCP did a Bhcg that was negative. She reports having had a cyst about 6 months ago that was supposed to be followed up but she never went for the follow up. She rates her pain as 6/10.  Past Medical History  Diagnosis Date  . High cholesterol   . Back pain     2012 back injury, in pain management  . Asthma   . Diabetes mellitus without complication     Type 2, no meds  . Ovarian cyst   . Abnormal vaginal bleeding   . IBS (irritable bowel syndrome)    Past Surgical History  Procedure Laterality Date  . Wisdom tooth extraction      1997  . Cholecystectomy      2000  . Tubal ligation      2005  . Endometrial ablation      2005  . Appendectomy      2010   History reviewed. No pertinent family history. History  Substance Use Topics  . Smoking status: Current Every Day Smoker -- 1.00 packs/day for 15 years    Types: Cigarettes  . Smokeless tobacco: Not on file  . Alcohol Use: No   OB History    Gravida Para Term Preterm AB TAB SAB Ectopic Multiple Living   Review of Systems  Gastrointestinal: Positive for abdominal pain.  Genitourinary: Positive for pelvic pain.   All other systems negative   Allergies  Zoloft  Home Medications   Prior to Admission medications   Medication Sig  Start Date End Date Taking? Authorizing Provider  albuterol (PROVENTIL HFA;VENTOLIN HFA) 108 (90 BASE) MCG/ACT inhaler Inhale 2 puffs into the lungs every 6 (six) hours as needed for wheezing or shortness of breath (When flovent does not work.).   Yes Historical Provider, MD  ALPRAZolam Prudy Feeler) 1 MG tablet Take 1 mg by mouth at bedtime as needed for sleep.   Yes Historical Provider, MD  buPROPion (WELLBUTRIN XL) 300 MG 24 hr tablet Take 300 mg by mouth daily.   Yes Historical Provider, MD  fluticasone (FLOVENT HFA) 220 MCG/ACT inhaler Inhale 2 puffs into the lungs 2 (two) times daily as needed (For shortness of breath.).   Yes Historical Provider, MD  omeprazole (PRILOSEC) 40 MG capsule Take 40 mg by mouth daily as needed (For heartburn.).   Yes Historical Provider, MD  oxyCODONE-acetaminophen (PERCOCET) 5-325 MG per tablet Take 1 tablet by mouth every 8 (eight) hours as needed for pain.   Yes Historical Provider, MD  simvastatin (ZOCOR) 20 MG tablet Take 20 mg by mouth every evening.   Yes Historical Provider, MD   BP 159/77 mmHg  Pulse 95  Temp(Src) 98.5 F (36.9 C) (Oral)  Resp 18  Ht 5\' 1"  (1.549 m)  Wt 260 lb (117.935 kg)  BMI 49.15 kg/m2 Physical Exam  Constitutional: She is oriented to person, place, and time. She appears well-developed and well-nourished. No distress.  Obese W/F  HENT:  Head: Normocephalic.  Eyes: EOM are normal.  Neck: Neck supple.  Cardiovascular: Normal rate.   Pulmonary/Chest: Effort normal.  Abdominal: Soft. There is tenderness. There is no rigidity, no rebound, no guarding and no CVA tenderness.  Tender lower abdomen.   Genitourinary:  External genitalia without lesions, white d/c vaginal vault, no CMT, no adnexal tenderness, unable to palpate uterus re patient habitus.   Musculoskeletal: Normal range of motion.  Neurological: She is alert and oriented to person, place, and time. No cranial nerve deficit.  Skin: Skin is warm and dry.  Psychiatric: She  has a normal mood and affect. Her behavior is normal.  Nursing note and vitals reviewed.   ED Course  Procedures  Results for orders placed or performed during the hospital encounter of 06/23/14 (from the past 24 hour(s))  Urinalysis, Routine w reflex microscopic     Status: None   Collection Time: 06/23/14  4:50 PM  Result Value Ref Range   Color, Urine YELLOW YELLOW   APPearance CLEAR CLEAR   Specific Gravity, Urine 1.020 1.005 - 1.030   pH 6.5 5.0 - 8.0   Glucose, UA NEGATIVE NEGATIVE mg/dL   Hgb urine dipstick NEGATIVE NEGATIVE   Bilirubin Urine NEGATIVE NEGATIVE   Ketones, ur NEGATIVE NEGATIVE mg/dL   Protein, ur NEGATIVE NEGATIVE mg/dL   Urobilinogen, UA 0.2 0.0 - 1.0 mg/dL   Nitrite NEGATIVE NEGATIVE   Leukocytes, UA NEGATIVE NEGATIVE  CBC with Differential/Platelet     Status: None   Collection Time: 06/23/14  5:33 PM  Result Value Ref Range   WBC 9.2 4.0 - 10.5 K/uL   RBC 4.73 3.87 - 5.11 MIL/uL   Hemoglobin 14.4 12.0 - 15.0 g/dL   HCT 78.242.3 95.636.0 - 21.346.0 %   MCV 89.4 78.0 - 100.0 fL   MCH 30.4 26.0 - 34.0 pg   MCHC 34.0 30.0 - 36.0 g/dL   RDW 08.612.9 57.811.5 - 46.915.5 %   Platelets 258 150 - 400 K/uL   Neutrophils Relative % 50 43 - 77 %   Neutro Abs 4.6 1.7 - 7.7 K/uL   Lymphocytes Relative 42 12 - 46 %   Lymphs Abs 3.8 0.7 - 4.0 K/uL   Monocytes Relative 6 3 - 12 %   Monocytes Absolute 0.6 0.1 - 1.0 K/uL   Eosinophils Relative 2 0 - 5 %   Eosinophils Absolute 0.2 0.0 - 0.7 K/uL   Basophils Relative 0 0 - 1 %   Basophils Absolute 0.0 0.0 - 0.1 K/uL  Wet prep, genital     Status: Abnormal   Collection Time: 06/23/14  5:37 PM  Result Value Ref Range   Yeast Wet Prep HPF POC NONE SEEN NONE SEEN   Trich, Wet Prep NONE SEEN NONE SEEN   Clue Cells Wet Prep HPF POC NONE SEEN NONE SEEN   WBC, Wet Prep HPF POC FEW (A) NONE SEEN    [place] Transvaginal Non-ob  06/23/2014   CLINICAL DATA:  Pelvic pain in a female with cramping and pregnancy symptoms for 2-3 weeks.  EXAM:  TRANSABDOMINAL AND TRANSVAGINAL ULTRASOUND OF PELVIS  TECHNIQUE: Both transabdominal and transvaginal ultrasound examinations of the pelvis were performed. Transabdominal technique was performed for global imaging of the pelvis including  uterus, ovaries, adnexal regions, and pelvic cul-de-sac. It was necessary to proceed with endovaginal exam following the transabdominal exam to visualize the uterus and ovaries.  COMPARISON:  04/12/2007  FINDINGS: Uterus  Measurements: 6 x 3 x 4 cm. No fibroids or other mass visualized.  Endometrium  Thickness is indeterminate due indistinct margins, a combination of post ablation changes (reportedly performed in 2005) and the orientation of the uterus. Chronic cystic structures in the cornual region may reflect myometrial cysts or post ablation change. There is chronic mineralization or other hyperechoic material in the upper fundic endometrial cavity.  Right ovary  Measurements: 2.5 x 2.2 x 2 cm. Normal appearance/no adnexal mass.  Left ovary  Measurements: 2.9 x 1.4 x 1.3 cm. Normal appearance/no adnexal mass.  Other findings  No free fluid.  IMPRESSION: 1. No change from 2008 to explain acute symptoms. 2. Chronic heterogeneity of the endometrium post ablation.   Electronically Signed   By: Marnee Spring M.D.   On: 06/23/2014 18:47   US Pelvis Complete  06/23/2014   CLINICAL DATA:  Pelvic pain in a female with cramping and pregnancy symptoms for 2-3 weeks.  EXAM: TRANSABDOMINAL AND TRANSVAGINAL ULTRASOUND OF PELVIS  TECHNIQUE: Both transabdominal and transvaginal ultrasound examinations of the pelvis were performed. Transabdominal technique was performed for global imaging of the pelvis including uterus, ovaries, adnexal regions, and pelvic cul-de-sac. It was necessary to proceed with endovaginal exam following the transabdominal exam to visualize the uterus and ovaries.  COMPARISON:  04/12/2007  FINDINGS: Uterus  Measurements: 6 x 3 x 4 cm. No fibroids or other mass  visualized.  Endometrium  Thickness is indeterminate due indistinct margins, a combination of post ablation changes (reportedly performed in 2005) and the orientation of the uterus. Chronic cystic structures in the cornual region may reflect myometrial cysts or post ablation change. There is chronic mineralization or other hyperechoic material in the upper fundic endometrial cavity.  Right ovary  Measurements: 2.5 x 2.2 x 2 cm. Normal appearance/no adnexal mass.  Left ovary  Measurements: 2.9 x 1.4 x 1.3 cm. Normal appearance/no adnexal mass.  Other findings  No free fluid.  IMPRESSION: 1. No change from 2008 to explain acute symptoms. 2. Chronic heterogeneity of the endometrium post ablation.   Electronically Signed   By: Marnee Spring M.D.   On: 06/23/2014 18:47     MDM  39 y.o. female with pelvic pain that started three weeks ago. She did see her PCP yesterday and had a negative Bhcg. Stable for d/c with normal labs and ultrasound without any acute findings. No acute abdomen. Will treat with NSAIDS for pain and she is to follow up with her PCP. I have reviewed this patient's vital signs, nurses notes, appropriate labs and imaging.  I have discussed findings and plan of care and all questions answered.   Final diagnoses:  Pelvic pain in female

## 2014-06-24 LAB — GC/CHLAMYDIA PROBE AMP (~~LOC~~) NOT AT ARMC
CHLAMYDIA, DNA PROBE: NEGATIVE
NEISSERIA GONORRHEA: NEGATIVE

## 2014-06-24 LAB — POCT PREGNANCY, URINE: Preg Test, Ur: NEGATIVE

## 2016-10-24 IMAGING — US US TRANSVAGINAL NON-OB
1 series · 13 of 25 positions shown · non-contrast
Comparison: 04/12/2007

CLINICAL DATA: Pelvic pain in a female with cramping and pregnancy
symptoms for 2-3 weeks.

EXAM:
TRANSABDOMINAL AND TRANSVAGINAL ULTRASOUND OF PELVIS
TECHNIQUE: Both transabdominal and transvaginal ultrasound examinations of the
pelvis were performed. Transabdominal technique was performed for
global imaging of the pelvis including uterus, ovaries, adnexal
regions, and pelvic cul-de-sac. It was necessary to proceed with
endovaginal exam following the transabdominal exam to visualize the
uterus and ovaries.

[Series 1: us pelvis complete · 36 acquisitions, 13 frames shown]
[im 1/36]
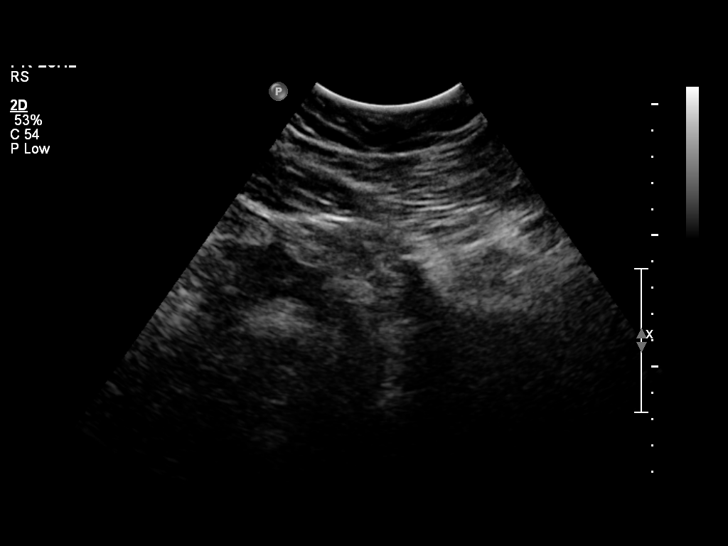
[im 3/36]
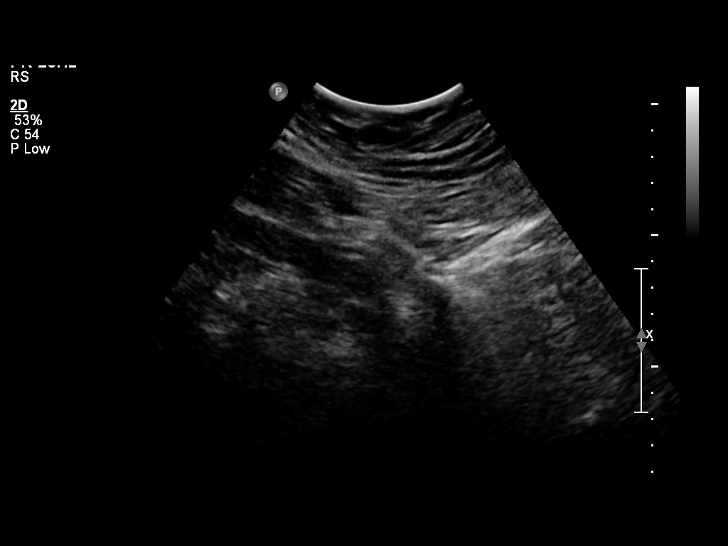
[im 6/36]
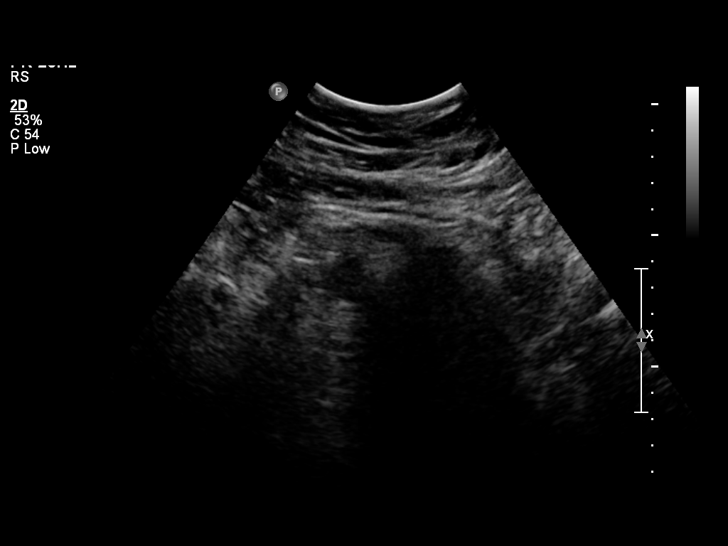
[im 9/36]
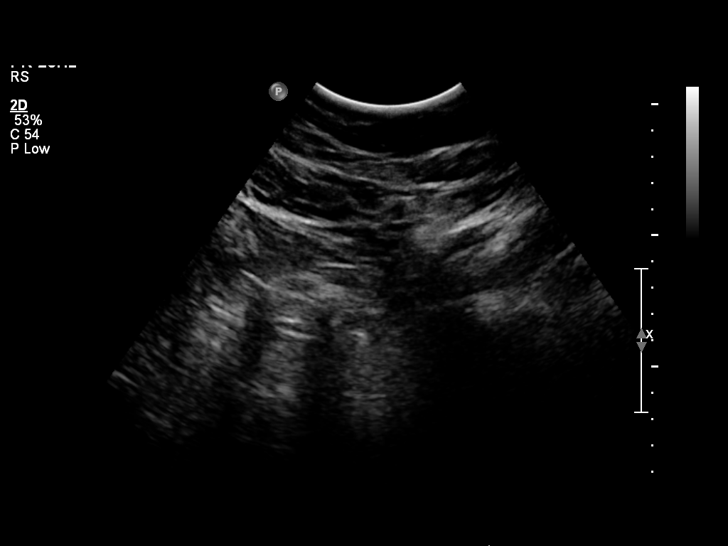
[im 12/36]
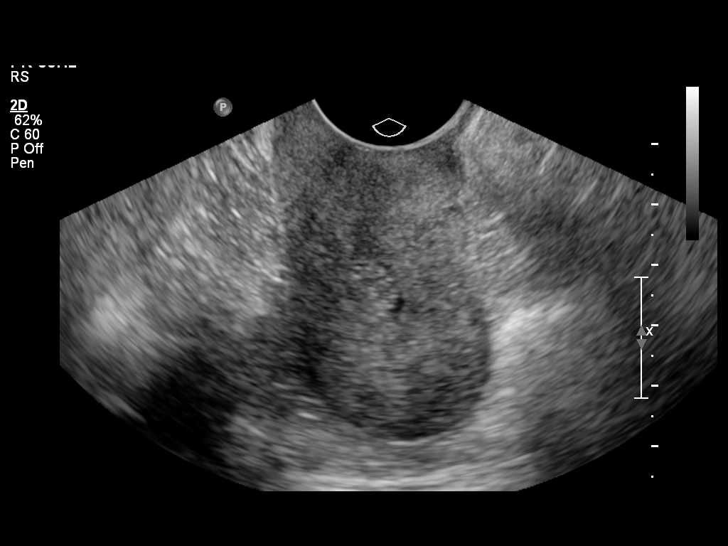
[im 15/36]
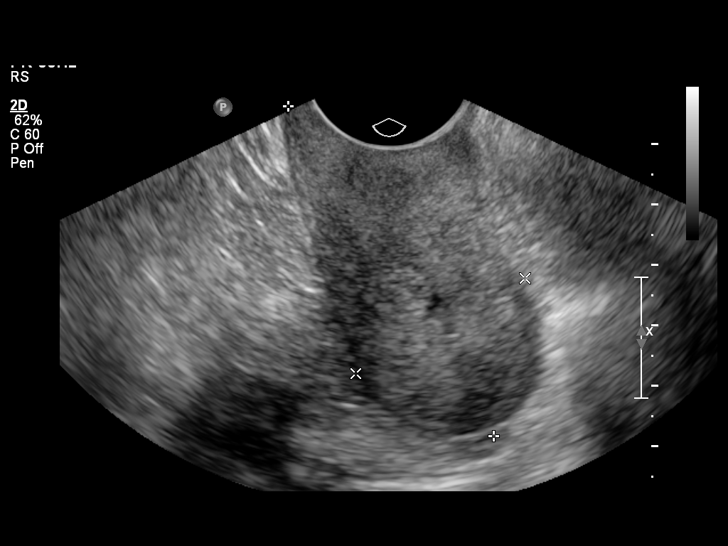
[im 18/36]
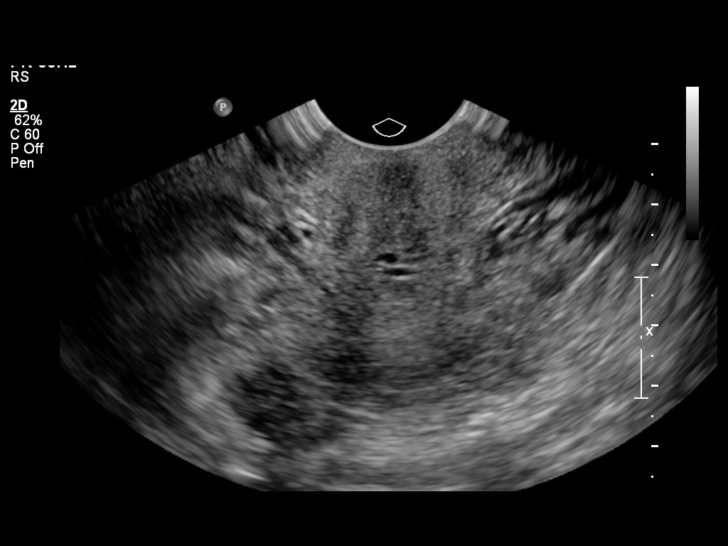
[im 21/36]
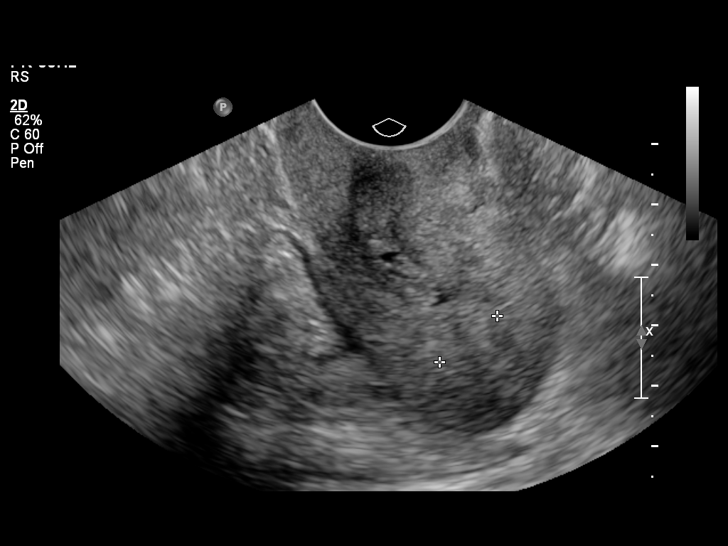
[im 24/36]
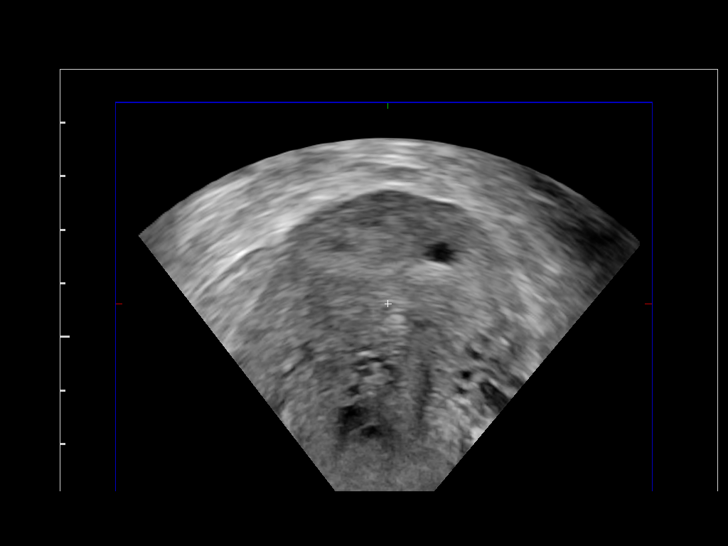
[im 27/36]
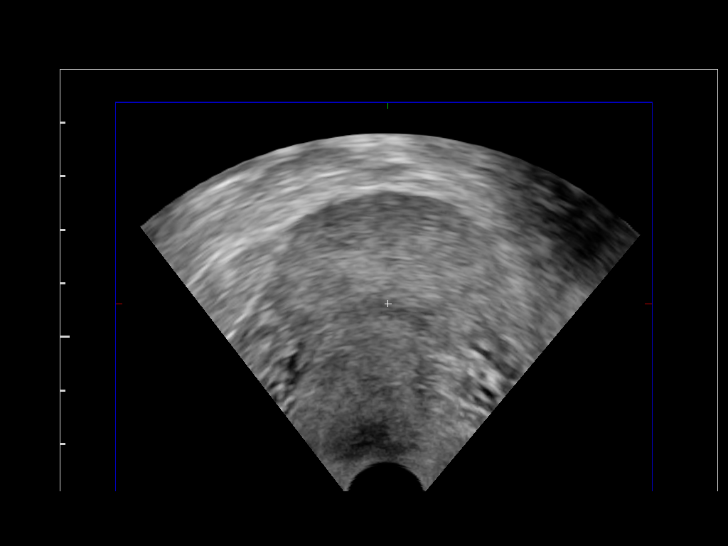
[im 30/36]
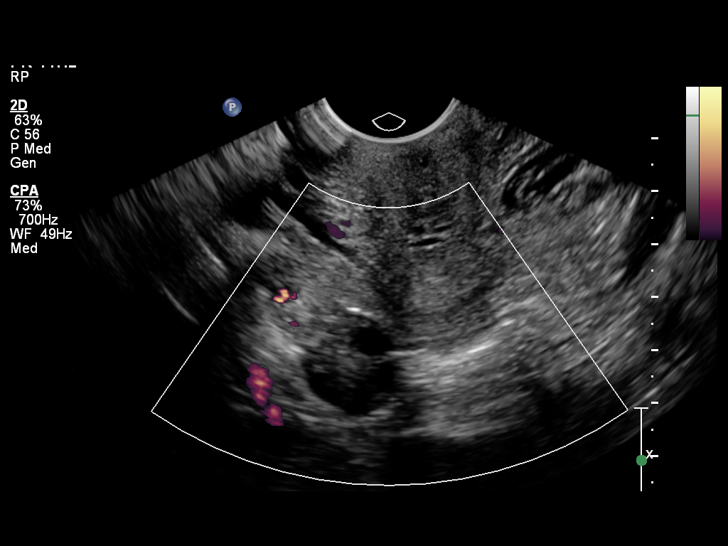
[im 33/36]
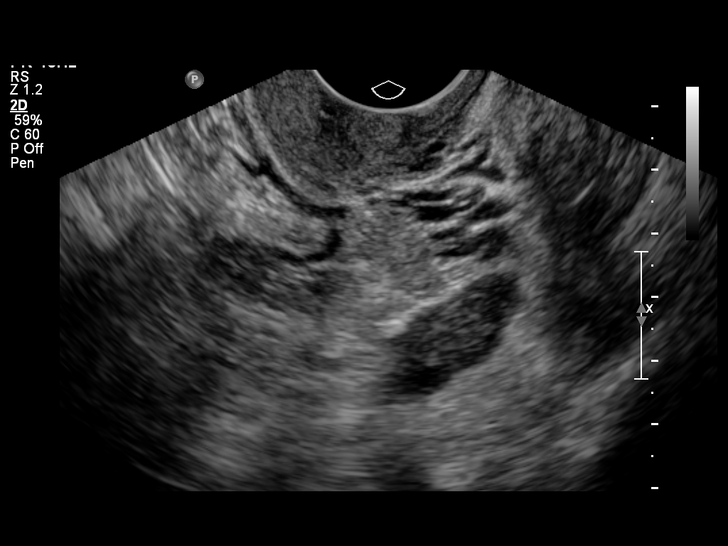
[im 36/36]
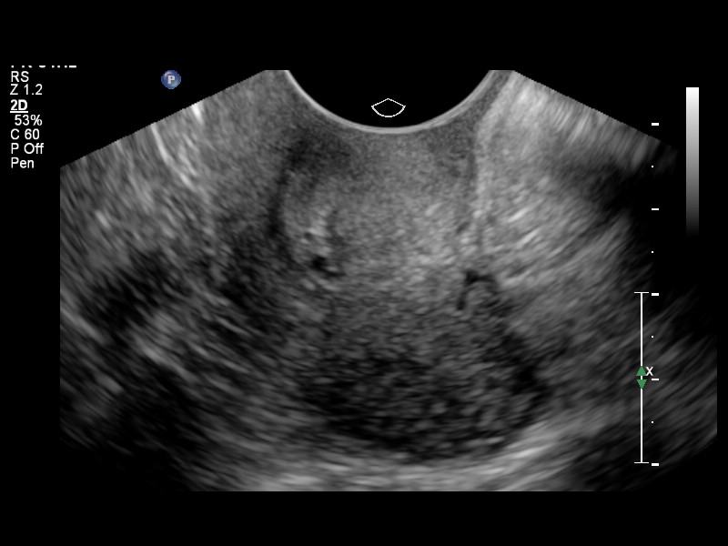

[13 of 25 positions shown; findings below may reference images not displayed]

FINDINGS: Uterus

Measurements: 6 x 3 x 4 cm. No fibroids or other mass visualized.

Endometrium

Thickness is indeterminate due indistinct margins, a combination of
post ablation changes (reportedly performed in 5449) and the
orientation of the uterus. Chronic cystic structures in the cornual
region may reflect myometrial cysts or post ablation change. There
is chronic mineralization or other hyperechoic material in the upper
fundic endometrial cavity.

Right ovary

Measurements: 2.5 x 2.2 x 2 cm. Normal appearance/no adnexal mass.

Left ovary

Measurements: 2.9 x 1.4 x 1.3 cm. Normal appearance/no adnexal mass.

Other findings

No free fluid.
IMPRESSION: 1. No change from 8446 to explain acute symptoms.
2. Chronic heterogeneity of the endometrium post ablation.
